# Patient Record
Sex: Female | Born: 1935 | Hispanic: No
Health system: Southern US, Community
[De-identification: ages and names within clinical notes are randomized; demographics above are authoritative.]

---

## 2004-01-08 ENCOUNTER — Other Ambulatory Visit: Payer: Self-pay

## 2004-07-01 ENCOUNTER — Other Ambulatory Visit: Payer: Self-pay

## 2004-11-04 ENCOUNTER — Ambulatory Visit: Payer: Self-pay | Admitting: Unknown Physician Specialty

## 2005-09-07 ENCOUNTER — Ambulatory Visit: Payer: Self-pay | Admitting: *Deleted

## 2005-09-19 ENCOUNTER — Ambulatory Visit: Payer: Self-pay | Admitting: *Deleted

## 2005-10-13 ENCOUNTER — Ambulatory Visit: Payer: Self-pay | Admitting: Unknown Physician Specialty

## 2005-11-29 ENCOUNTER — Ambulatory Visit: Payer: Self-pay | Admitting: Internal Medicine

## 2005-12-09 ENCOUNTER — Inpatient Hospital Stay: Payer: Self-pay | Admitting: Unknown Physician Specialty

## 2005-12-09 ENCOUNTER — Other Ambulatory Visit: Payer: Self-pay

## 2006-01-10 ENCOUNTER — Ambulatory Visit: Payer: Self-pay | Admitting: Unknown Physician Specialty

## 2006-01-22 ENCOUNTER — Ambulatory Visit: Payer: Self-pay | Admitting: Internal Medicine

## 2006-05-29 ENCOUNTER — Ambulatory Visit: Payer: Self-pay | Admitting: Internal Medicine

## 2006-10-26 ENCOUNTER — Ambulatory Visit: Payer: Self-pay | Admitting: Pain Medicine

## 2006-11-15 ENCOUNTER — Ambulatory Visit: Payer: Self-pay | Admitting: Pain Medicine

## 2006-12-19 ENCOUNTER — Ambulatory Visit: Payer: Self-pay | Admitting: Pain Medicine

## 2007-01-03 ENCOUNTER — Ambulatory Visit: Payer: Self-pay | Admitting: Pain Medicine

## 2007-02-01 ENCOUNTER — Ambulatory Visit: Payer: Self-pay | Admitting: Pain Medicine

## 2007-02-12 ENCOUNTER — Ambulatory Visit: Payer: Self-pay | Admitting: Pain Medicine

## 2007-03-29 ENCOUNTER — Ambulatory Visit: Payer: Self-pay | Admitting: Pain Medicine

## 2007-04-09 ENCOUNTER — Ambulatory Visit: Payer: Self-pay | Admitting: Pain Medicine

## 2007-05-08 ENCOUNTER — Ambulatory Visit: Payer: Self-pay | Admitting: Pain Medicine

## 2007-05-14 ENCOUNTER — Ambulatory Visit: Payer: Self-pay | Admitting: Pain Medicine

## 2007-05-28 ENCOUNTER — Ambulatory Visit: Payer: Self-pay | Admitting: Internal Medicine

## 2007-06-21 ENCOUNTER — Ambulatory Visit: Payer: Self-pay | Admitting: Pain Medicine

## 2007-06-27 ENCOUNTER — Ambulatory Visit: Payer: Self-pay | Admitting: Pain Medicine

## 2007-08-09 ENCOUNTER — Ambulatory Visit: Payer: Self-pay | Admitting: Pain Medicine

## 2007-10-22 ENCOUNTER — Ambulatory Visit: Payer: Self-pay | Admitting: Pain Medicine

## 2007-10-29 ENCOUNTER — Ambulatory Visit: Payer: Self-pay | Admitting: Pain Medicine

## 2007-11-20 ENCOUNTER — Ambulatory Visit: Payer: Self-pay | Admitting: Pain Medicine

## 2007-12-03 ENCOUNTER — Ambulatory Visit: Payer: Self-pay | Admitting: Pain Medicine

## 2008-01-29 ENCOUNTER — Ambulatory Visit: Payer: Self-pay | Admitting: Pain Medicine

## 2008-03-25 ENCOUNTER — Ambulatory Visit: Payer: Self-pay | Admitting: Pain Medicine

## 2010-02-08 ENCOUNTER — Ambulatory Visit: Payer: Self-pay | Admitting: Internal Medicine

## 2011-12-12 ENCOUNTER — Emergency Department: Payer: Self-pay | Admitting: Emergency Medicine

## 2012-05-21 ENCOUNTER — Inpatient Hospital Stay: Payer: Self-pay | Admitting: Specialist

## 2012-05-21 LAB — COMPREHENSIVE METABOLIC PANEL
Albumin: 3.8 g/dL (ref 3.4–5.0)
Alkaline Phosphatase: 96 U/L (ref 50–136)
Anion Gap: 11 (ref 7–16)
BUN: 25 mg/dL — ABNORMAL HIGH (ref 7–18)
Bilirubin,Total: 0.6 mg/dL (ref 0.2–1.0)
Calcium, Total: 8.8 mg/dL (ref 8.5–10.1)
Creatinine: 1.41 mg/dL — ABNORMAL HIGH (ref 0.60–1.30)
EGFR (Non-African Amer.): 32 — ABNORMAL LOW
Glucose: 110 mg/dL — ABNORMAL HIGH (ref 65–99)
Osmolality: 281 (ref 275–301)
Potassium: 3.5 mmol/L (ref 3.5–5.1)
SGPT (ALT): 16 U/L
Sodium: 138 mmol/L (ref 136–145)

## 2012-05-21 LAB — URINALYSIS, COMPLETE
Blood: NEGATIVE
Ketone: NEGATIVE
Nitrite: NEGATIVE
Ph: 6 (ref 4.5–8.0)
Protein: 100
Specific Gravity: 1.01 (ref 1.003–1.030)
WBC UR: <1 /HPF (ref 0–5)

## 2012-05-21 LAB — CBC
HGB: 12.7 g/dL (ref 12.0–16.0)
MCH: 28 pg (ref 26.0–34.0)
Platelet: 196 10*3/uL (ref 150–440)
RBC: 4.54 10*6/uL (ref 3.80–5.20)
RDW: 15.4 % — ABNORMAL HIGH (ref 11.5–14.5)
WBC: 9.7 10*3/uL (ref 3.6–11.0)

## 2012-05-21 LAB — PROTIME-INR: Prothrombin Time: 13.2 secs (ref 11.5–14.7)

## 2012-05-21 LAB — CK TOTAL AND CKMB (NOT AT ARMC): CK-MB: 1.2 ng/mL (ref 0.5–3.6)

## 2012-05-21 LAB — TROPONIN I: Troponin-I: <0.02 ng/mL

## 2012-05-21 LAB — APTT: Activated PTT: 31.2 secs (ref 23.6–35.9)

## 2012-05-23 LAB — CBC WITH DIFFERENTIAL/PLATELET
Basophil #: 0.1 10*3/uL (ref 0.0–0.1)
Basophil %: 0.6 %
Eosinophil #: 0.1 10*3/uL (ref 0.0–0.7)
HCT: 23.6 % — ABNORMAL LOW (ref 35.0–47.0)
Lymphocyte #: 1 10*3/uL (ref 1.0–3.6)
MCH: 27.9 pg (ref 26.0–34.0)
MCHC: 32.2 g/dL (ref 32.0–36.0)
MCV: 87 fL (ref 80–100)
RBC: 2.73 10*6/uL — ABNORMAL LOW (ref 3.80–5.20)
RDW: 15.3 % — ABNORMAL HIGH (ref 11.5–14.5)
WBC: 11.3 10*3/uL — ABNORMAL HIGH (ref 3.6–11.0)

## 2012-05-23 LAB — BASIC METABOLIC PANEL
Anion Gap: 11 (ref 7–16)
BUN: 32 mg/dL — ABNORMAL HIGH (ref 7–18)
Co2: 26 mmol/L (ref 21–32)
Creatinine: 2.06 mg/dL — ABNORMAL HIGH (ref 0.60–1.30)
EGFR (Non-African Amer.): 20 — ABNORMAL LOW
Glucose: 128 mg/dL — ABNORMAL HIGH (ref 65–99)
Osmolality: 280 (ref 275–301)
Potassium: 4.5 mmol/L (ref 3.5–5.1)

## 2012-05-24 LAB — BASIC METABOLIC PANEL
Anion Gap: 8 (ref 7–16)
BUN: 37 mg/dL — ABNORMAL HIGH (ref 7–18)
EGFR (African American): 24 — ABNORMAL LOW
EGFR (Non-African Amer.): 21 — ABNORMAL LOW
Glucose: 105 mg/dL — ABNORMAL HIGH (ref 65–99)
Osmolality: 277 (ref 275–301)
Potassium: 4.2 mmol/L (ref 3.5–5.1)
Sodium: 134 mmol/L — ABNORMAL LOW (ref 136–145)

## 2012-05-24 LAB — CBC WITH DIFFERENTIAL/PLATELET
Basophil #: 0 10*3/uL (ref 0.0–0.1)
Basophil %: 0.3 %
Eosinophil %: 1.4 %
HGB: 7.8 g/dL — ABNORMAL LOW (ref 12.0–16.0)
Lymphocyte %: 8.9 %
MCH: 28.7 pg (ref 26.0–34.0)
MCHC: 33.3 g/dL (ref 32.0–36.0)
MCV: 86 fL (ref 80–100)
Monocyte #: 0.9 x10 3/mm (ref 0.2–0.9)
Neutrophil #: 8 10*3/uL — ABNORMAL HIGH (ref 1.4–6.5)
Neutrophil %: 80.5 %
RBC: 2.72 10*6/uL — ABNORMAL LOW (ref 3.80–5.20)

## 2012-05-25 ENCOUNTER — Encounter: Payer: Self-pay | Admitting: Internal Medicine

## 2012-05-25 LAB — CBC WITH DIFFERENTIAL/PLATELET
Basophil %: 0.3 %
Eosinophil #: 0.2 10*3/uL (ref 0.0–0.7)
Eosinophil %: 2.1 %
HCT: 26 % — ABNORMAL LOW (ref 35.0–47.0)
Lymphocyte %: 9.5 %
MCH: 30.5 pg (ref 26.0–34.0)
MCV: 88 fL (ref 80–100)
Monocyte #: 0.8 x10 3/mm (ref 0.2–0.9)
Monocyte %: 9 %
Neutrophil #: 6.8 10*3/uL — ABNORMAL HIGH (ref 1.4–6.5)
RDW: 15.1 % — ABNORMAL HIGH (ref 11.5–14.5)
WBC: 8.5 10*3/uL (ref 3.6–11.0)

## 2012-05-25 LAB — BASIC METABOLIC PANEL
Anion Gap: 9 (ref 7–16)
BUN: 33 mg/dL — ABNORMAL HIGH (ref 7–18)
Calcium, Total: 7.5 mg/dL — ABNORMAL LOW (ref 8.5–10.1)
Chloride: 102 mmol/L (ref 98–107)
EGFR (Non-African Amer.): 27 — ABNORMAL LOW
Glucose: 102 mg/dL — ABNORMAL HIGH (ref 65–99)
Osmolality: 281 (ref 275–301)
Potassium: 4.3 mmol/L (ref 3.5–5.1)

## 2012-06-07 ENCOUNTER — Encounter: Payer: Self-pay | Admitting: Internal Medicine

## 2012-07-08 ENCOUNTER — Encounter: Payer: Self-pay | Admitting: Internal Medicine

## 2013-05-07 ENCOUNTER — Ambulatory Visit: Payer: Self-pay | Admitting: Internal Medicine

## 2013-05-19 ENCOUNTER — Inpatient Hospital Stay: Payer: Self-pay | Admitting: Internal Medicine

## 2013-05-19 LAB — CBC
HCT: 25.4 % — ABNORMAL LOW (ref 35.0–47.0)
HGB: 8.3 g/dL — ABNORMAL LOW (ref 12.0–16.0)
MCH: 28.6 pg (ref 26.0–34.0)
MCV: 87 fL (ref 80–100)
Platelet: 129 10*3/uL — ABNORMAL LOW (ref 150–440)
RDW: 15.3 % — ABNORMAL HIGH (ref 11.5–14.5)
WBC: 7.1 10*3/uL (ref 3.6–11.0)

## 2013-05-19 LAB — URINALYSIS, COMPLETE
Bilirubin,UR: NEGATIVE
Blood: NEGATIVE
Nitrite: NEGATIVE
Ph: 6 (ref 4.5–8.0)
Protein: 100
RBC,UR: 2 /HPF (ref 0–5)
Specific Gravity: 1.016 (ref 1.003–1.030)
Squamous Epithelial: 6
WBC UR: 2 /HPF (ref 0–5)

## 2013-05-19 LAB — BASIC METABOLIC PANEL
BUN: 39 mg/dL — ABNORMAL HIGH (ref 7–18)
Calcium, Total: 8.3 mg/dL — ABNORMAL LOW (ref 8.5–10.1)
Co2: 39 mmol/L — ABNORMAL HIGH (ref 21–32)
Creatinine: 1.63 mg/dL — ABNORMAL HIGH (ref 0.60–1.30)
EGFR (African American): 31 — ABNORMAL LOW
Potassium: 4 mmol/L (ref 3.5–5.1)
Sodium: 141 mmol/L (ref 136–145)

## 2013-05-19 LAB — TROPONIN I: Troponin-I: 0.03 ng/mL

## 2013-05-20 LAB — CBC WITH DIFFERENTIAL/PLATELET
Basophil #: 0 10*3/uL (ref 0.0–0.1)
Eosinophil #: 0 10*3/uL (ref 0.0–0.7)
Eosinophil %: 0 %
Lymphocyte %: 10.6 %
MCHC: 32.5 g/dL (ref 32.0–36.0)
MCV: 87 fL (ref 80–100)
Monocyte #: 0.1 x10 3/mm — ABNORMAL LOW (ref 0.2–0.9)
Monocyte %: 2.2 %
RBC: 3.13 10*6/uL — ABNORMAL LOW (ref 3.80–5.20)
RDW: 15 % — ABNORMAL HIGH (ref 11.5–14.5)
WBC: 6 10*3/uL (ref 3.6–11.0)

## 2013-05-20 LAB — IRON AND TIBC
Iron Bind.Cap.(Total): 249 ug/dL — ABNORMAL LOW (ref 250–450)
Iron Saturation: 16 %
Iron: 41 ug/dL — ABNORMAL LOW (ref 50–170)
Unbound Iron-Bind.Cap.: 208 ug/dL

## 2013-05-20 LAB — BASIC METABOLIC PANEL
Anion Gap: 3 — ABNORMAL LOW (ref 7–16)
BUN: 37 mg/dL — ABNORMAL HIGH (ref 7–18)
Calcium, Total: 8.6 mg/dL (ref 8.5–10.1)
Chloride: 100 mmol/L (ref 98–107)
Co2: 38 mmol/L — ABNORMAL HIGH (ref 21–32)
Creatinine: 1.47 mg/dL — ABNORMAL HIGH (ref 0.60–1.30)
EGFR (Non-African Amer.): 30 — ABNORMAL LOW
Glucose: 121 mg/dL — ABNORMAL HIGH (ref 65–99)

## 2013-05-20 LAB — HEMOGLOBIN A1C: Hemoglobin A1C: 5.2 % (ref 4.2–6.3)

## 2013-05-20 LAB — PRO B NATRIURETIC PEPTIDE: B-Type Natriuretic Peptide: 18431 pg/mL — ABNORMAL HIGH (ref 0–450)

## 2013-05-20 LAB — TROPONIN I: Troponin-I: <0.02 ng/mL

## 2013-05-21 LAB — CBC WITH DIFFERENTIAL/PLATELET
Basophil #: 0 10*3/uL (ref 0.0–0.1)
Eosinophil %: 0.6 %
HCT: 25.2 % — ABNORMAL LOW (ref 35.0–47.0)
HGB: 8.4 g/dL — ABNORMAL LOW (ref 12.0–16.0)
Lymphocyte %: 11.1 %
MCHC: 33.5 g/dL (ref 32.0–36.0)
Monocyte #: 0.7 x10 3/mm (ref 0.2–0.9)
Neutrophil #: 7.8 10*3/uL — ABNORMAL HIGH (ref 1.4–6.5)
RBC: 2.95 10*6/uL — ABNORMAL LOW (ref 3.80–5.20)
RDW: 15.3 % — ABNORMAL HIGH (ref 11.5–14.5)
WBC: 9.7 10*3/uL (ref 3.6–11.0)

## 2013-05-21 LAB — BASIC METABOLIC PANEL
BUN: 48 mg/dL — ABNORMAL HIGH (ref 7–18)
Calcium, Total: 8.3 mg/dL — ABNORMAL LOW (ref 8.5–10.1)
Chloride: 98 mmol/L (ref 98–107)
Creatinine: 1.8 mg/dL — ABNORMAL HIGH (ref 0.60–1.30)
Glucose: 107 mg/dL — ABNORMAL HIGH (ref 65–99)
Potassium: 3.8 mmol/L (ref 3.5–5.1)

## 2013-05-22 LAB — BASIC METABOLIC PANEL
Anion Gap: 3 — ABNORMAL LOW (ref 7–16)
BUN: 61 mg/dL — ABNORMAL HIGH (ref 7–18)
Calcium, Total: 8.4 mg/dL — ABNORMAL LOW (ref 8.5–10.1)
Chloride: 96 mmol/L — ABNORMAL LOW (ref 98–107)
Creatinine: 2.14 mg/dL — ABNORMAL HIGH (ref 0.60–1.30)
EGFR (Non-African Amer.): 19 — ABNORMAL LOW
Glucose: 141 mg/dL — ABNORMAL HIGH (ref 65–99)
Osmolality: 295 (ref 275–301)
Sodium: 138 mmol/L (ref 136–145)

## 2013-05-23 LAB — CBC WITH DIFFERENTIAL/PLATELET
Basophil #: 0 10*3/uL (ref 0.0–0.1)
HCT: 24.8 % — ABNORMAL LOW (ref 35.0–47.0)
HGB: 8.4 g/dL — ABNORMAL LOW (ref 12.0–16.0)
Lymphocyte %: 7.5 %
MCH: 29.1 pg (ref 26.0–34.0)
MCHC: 33.9 g/dL (ref 32.0–36.0)
MCV: 86 fL (ref 80–100)
Monocyte #: 0.3 x10 3/mm (ref 0.2–0.9)
Monocyte %: 3.8 %
Neutrophil %: 88.6 %
Platelet: 127 10*3/uL — ABNORMAL LOW (ref 150–440)
RDW: 15.3 % — ABNORMAL HIGH (ref 11.5–14.5)
WBC: 8.3 10*3/uL (ref 3.6–11.0)

## 2013-05-23 LAB — BASIC METABOLIC PANEL
Anion Gap: 4 — ABNORMAL LOW (ref 7–16)
BUN: 76 mg/dL — ABNORMAL HIGH (ref 7–18)
Calcium, Total: 8.2 mg/dL — ABNORMAL LOW (ref 8.5–10.1)
Chloride: 97 mmol/L — ABNORMAL LOW (ref 98–107)
Creatinine: 2.36 mg/dL — ABNORMAL HIGH (ref 0.60–1.30)
Osmolality: 300 (ref 275–301)
Potassium: 4.2 mmol/L (ref 3.5–5.1)
Sodium: 137 mmol/L (ref 136–145)

## 2013-05-24 ENCOUNTER — Encounter: Payer: Self-pay | Admitting: Internal Medicine

## 2013-05-24 LAB — BASIC METABOLIC PANEL
Anion Gap: 2 — ABNORMAL LOW (ref 7–16)
BUN: 83 mg/dL — ABNORMAL HIGH (ref 7–18)
Calcium, Total: 8.3 mg/dL — ABNORMAL LOW (ref 8.5–10.1)
Creatinine: 2.28 mg/dL — ABNORMAL HIGH (ref 0.60–1.30)
EGFR (African American): 21 — ABNORMAL LOW
EGFR (Non-African Amer.): 18 — ABNORMAL LOW
Glucose: 105 mg/dL — ABNORMAL HIGH (ref 65–99)
Osmolality: 299 (ref 275–301)
Potassium: 4 mmol/L (ref 3.5–5.1)
Sodium: 137 mmol/L (ref 136–145)

## 2013-05-24 LAB — CULTURE, BLOOD (SINGLE)

## 2013-05-28 LAB — CBC WITH DIFFERENTIAL/PLATELET
Basophil #: 0 10*3/uL (ref 0.0–0.1)
Basophil %: 0.1 %
Eosinophil #: 0.2 10*3/uL (ref 0.0–0.7)
Eosinophil %: 1.8 %
HCT: 25.2 % — ABNORMAL LOW (ref 35.0–47.0)
HGB: 8.5 g/dL — ABNORMAL LOW (ref 12.0–16.0)
Lymphocyte #: 1 10*3/uL (ref 1.0–3.6)
MCH: 29.1 pg (ref 26.0–34.0)
MCV: 87 fL (ref 80–100)
Monocyte #: 0.9 x10 3/mm (ref 0.2–0.9)
Monocyte %: 9.8 %
Neutrophil #: 6.9 10*3/uL — ABNORMAL HIGH (ref 1.4–6.5)
RBC: 2.91 10*6/uL — ABNORMAL LOW (ref 3.80–5.20)

## 2013-05-28 LAB — BASIC METABOLIC PANEL
Chloride: 99 mmol/L (ref 98–107)
Co2: 36 mmol/L — ABNORMAL HIGH (ref 21–32)
Creatinine: 2.35 mg/dL — ABNORMAL HIGH (ref 0.60–1.30)
EGFR (African American): 20 — ABNORMAL LOW
EGFR (Non-African Amer.): 17 — ABNORMAL LOW
Glucose: 105 mg/dL — ABNORMAL HIGH (ref 65–99)

## 2013-05-31 LAB — CBC WITH DIFFERENTIAL/PLATELET
Basophil #: 0 10*3/uL (ref 0.0–0.1)
Eosinophil #: 0.2 10*3/uL (ref 0.0–0.7)
Lymphocyte #: 1.1 10*3/uL (ref 1.0–3.6)
Lymphocyte %: 14.7 %
MCH: 28.4 pg (ref 26.0–34.0)
MCHC: 32.4 g/dL (ref 32.0–36.0)
MCV: 88 fL (ref 80–100)
Monocyte #: 0.5 x10 3/mm (ref 0.2–0.9)
Monocyte %: 7.3 %
Neutrophil #: 5.6 10*3/uL (ref 1.4–6.5)
Neutrophil %: 74.6 %
WBC: 7.5 10*3/uL (ref 3.6–11.0)

## 2013-05-31 LAB — BASIC METABOLIC PANEL
Anion Gap: 4 — ABNORMAL LOW (ref 7–16)
Calcium, Total: 8.4 mg/dL — ABNORMAL LOW (ref 8.5–10.1)
Co2: 34 mmol/L — ABNORMAL HIGH (ref 21–32)
Glucose: 106 mg/dL — ABNORMAL HIGH (ref 65–99)
Osmolality: 296 (ref 275–301)
Potassium: 4.8 mmol/L (ref 3.5–5.1)
Sodium: 139 mmol/L (ref 136–145)

## 2013-06-07 ENCOUNTER — Ambulatory Visit: Payer: Self-pay | Admitting: Nurse Practitioner

## 2013-06-07 ENCOUNTER — Encounter: Payer: Self-pay | Admitting: Internal Medicine

## 2013-08-07 DEATH — deceased

## 2013-09-08 IMAGING — CT CT CHEST W/O CM
2 series · 16 of 30 positions shown, 19 images · non-contrast
Comparison: none

REASON FOR EXAM: progressive dyspnea/abnormal CXR in pt with pulmonary
fibrosis/chronic resp fail
COMMENTS:

[Series 2: soft tissue · axial · 0.59mm/px · z∈[-74,+40]mm · 4 of 94 slices shown]
[im 7/94  mediastinal]
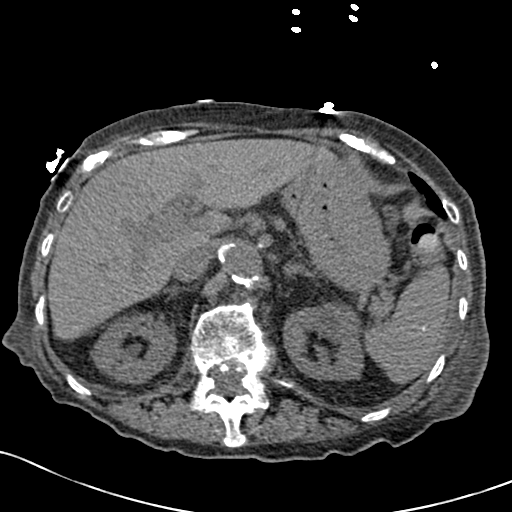
[im 20/94  mediastinal]
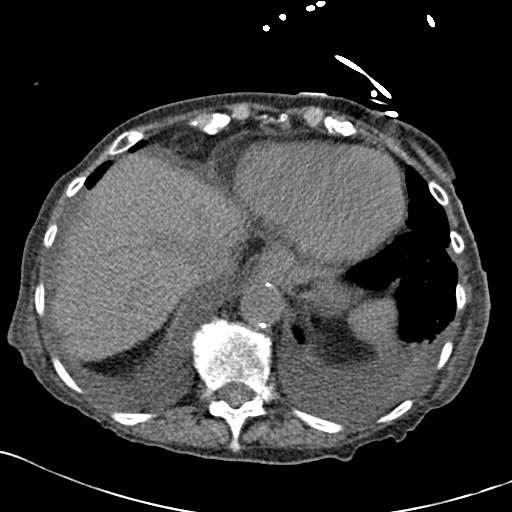
[im 34/94  mediastinal]
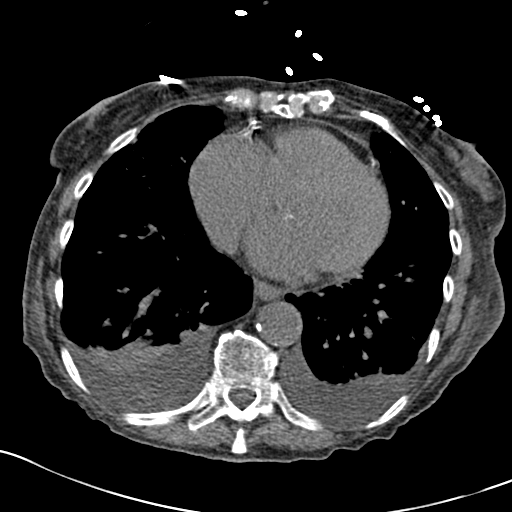
[im 45/94  mediastinal]
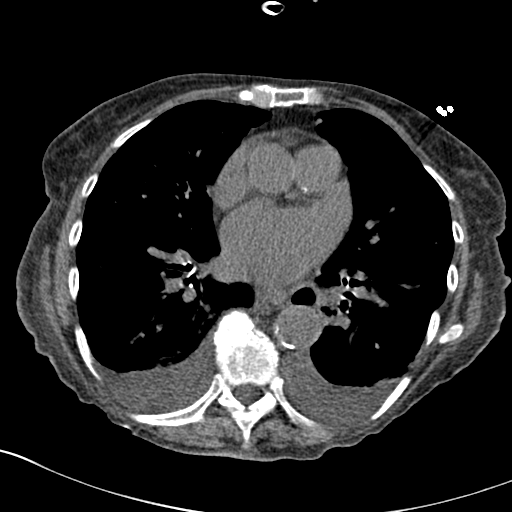

[Series 4: lung windows · axial · 0.59mm/px · z∈[-41,+166]mm · 12 of 83 slices shown, 15 images]
[im 7/83  mediastinal]
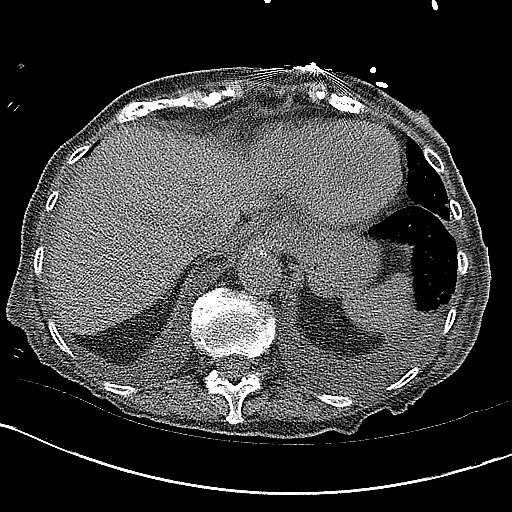
[im 7/83  lung]
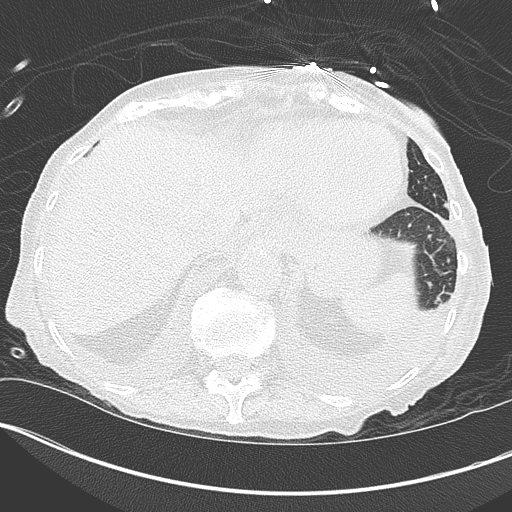
[im 14/83  lung]
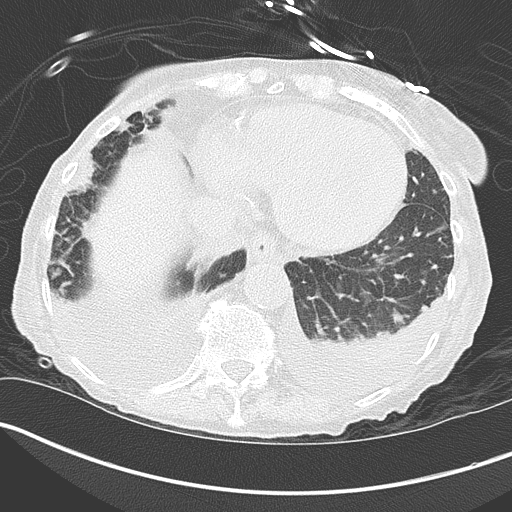
[im 21/83  lung]
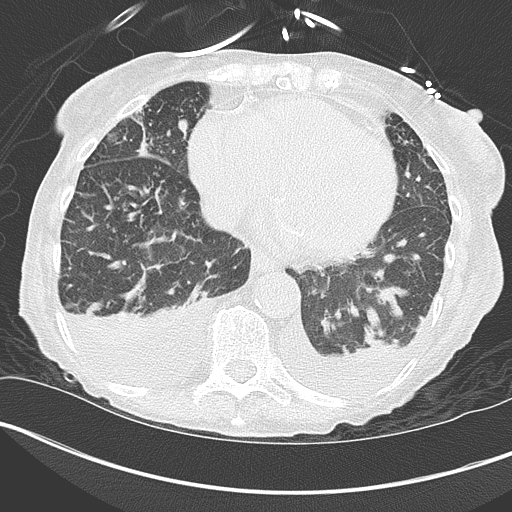
[im 28/83  lung]
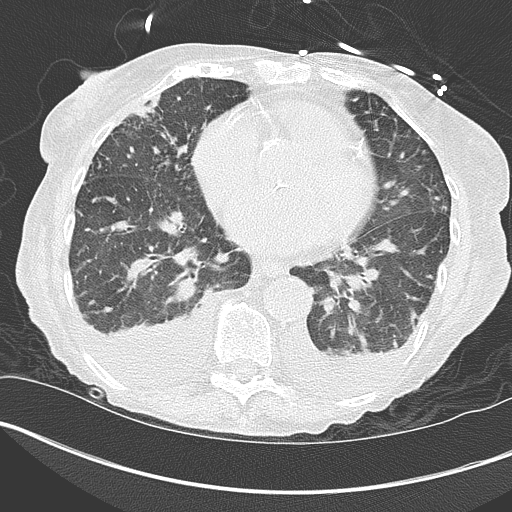
[im 34/83  mediastinal]
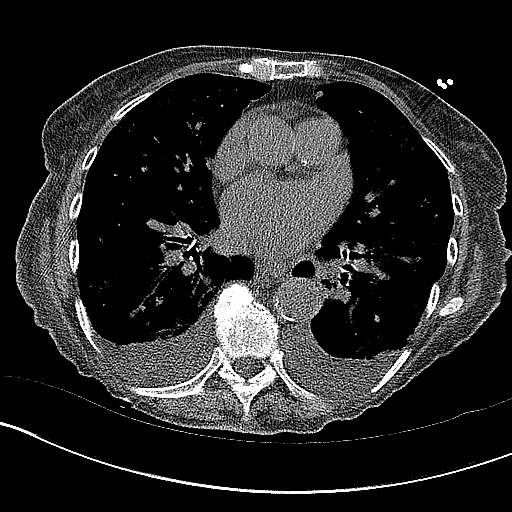
[im 34/83  lung]
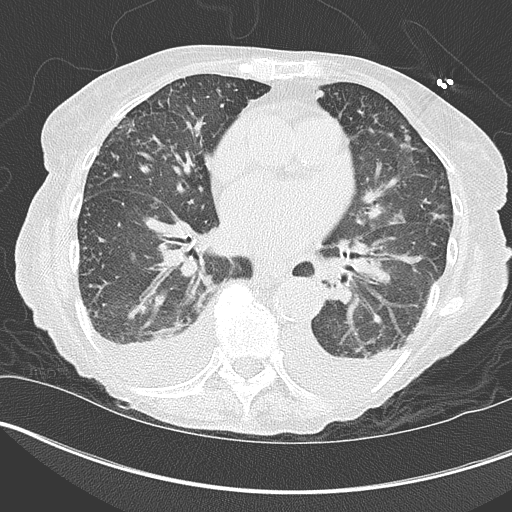
[im 35/83  lung]
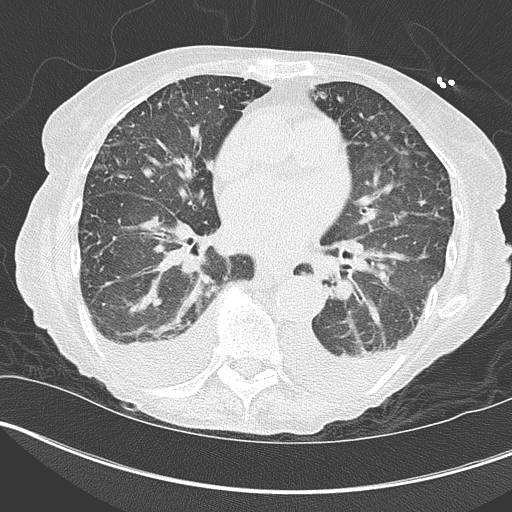
[im 42/83  lung]
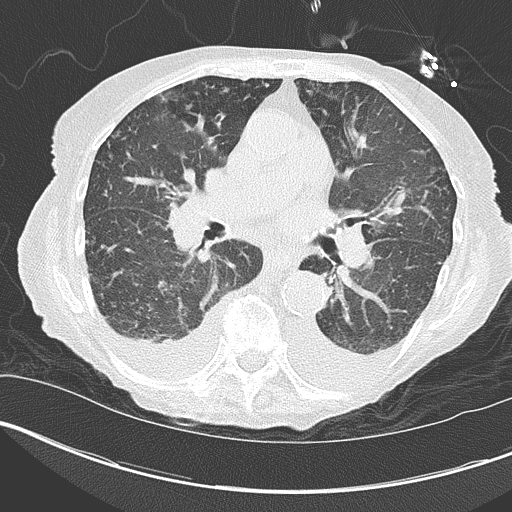
[im 48/83  lung]
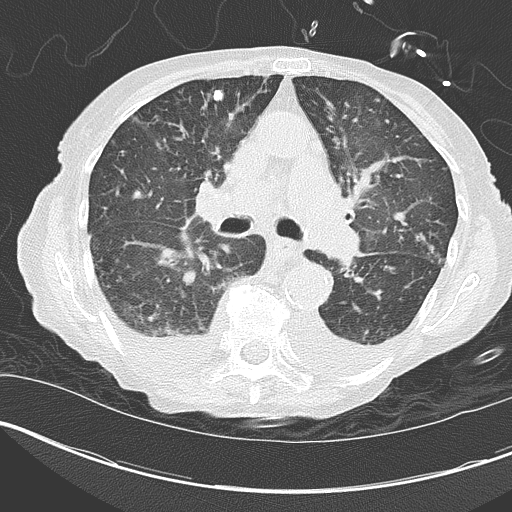
[im 55/83  mediastinal]
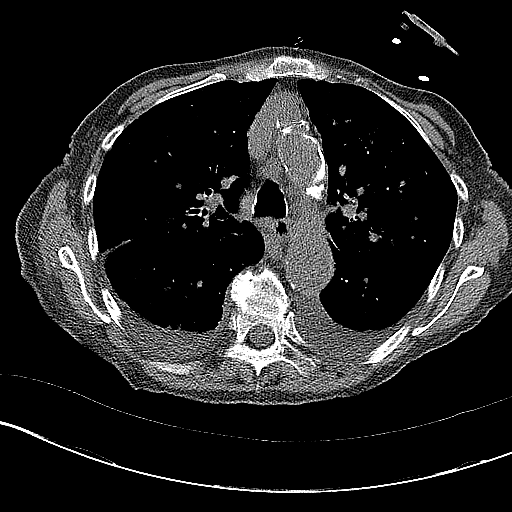
[im 55/83  lung]
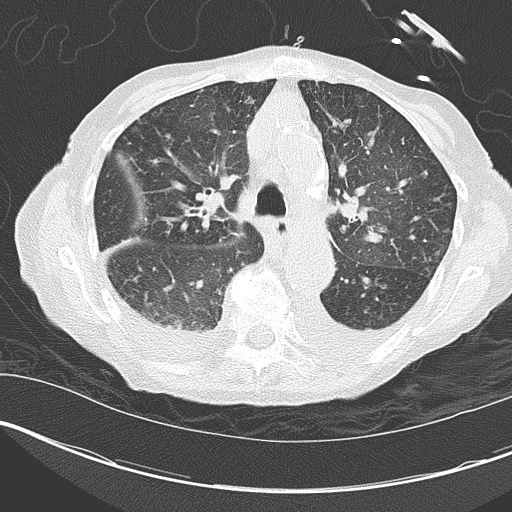
[im 62/83  lung]
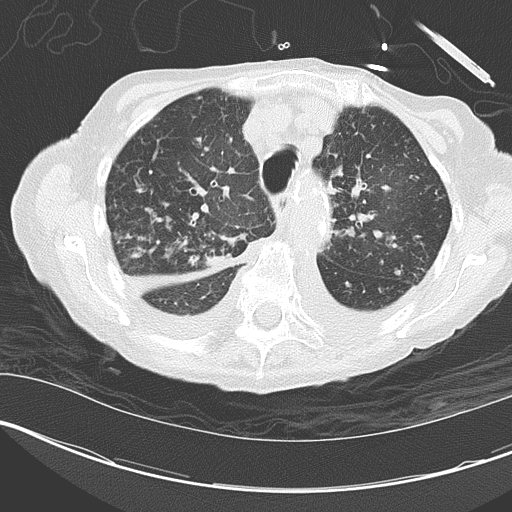
[im 69/83  lung]
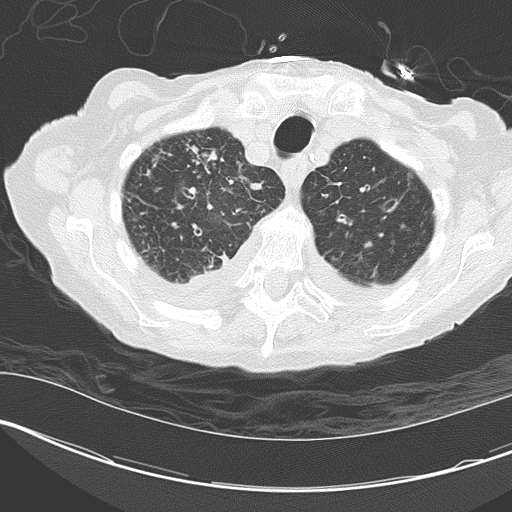
[im 76/83  lung]
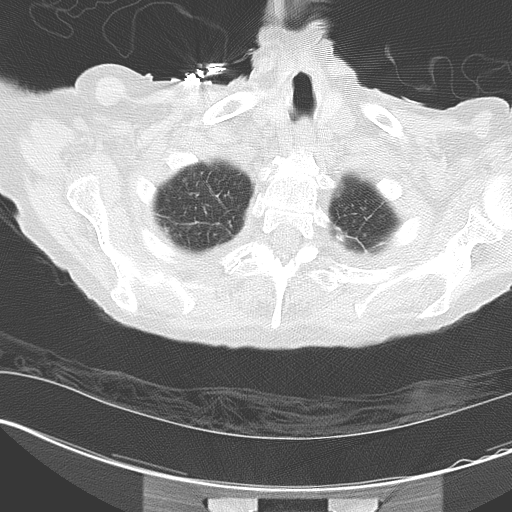

[16 of 30 positions shown; findings below may reference images not displayed]

PROCEDURE:     CT  - CT CHEST WITHOUT CONTRAST  - May 20, 2013  [DATE]

RESULT:     Axial CT scanning was performed through the chest with
reconstructions at 3 mm intervals and slice thicknesses. Images were
repeated due to motion artifact.

At mediastinal window settings there are small to moderate sized bilateral
pleural effusions layering posteriorly. There are mildly enlarged
pretracheal and precarinal and subcarinal lymph nodes measuring up to 2 cm
in long axis. There are smaller enlarged hilar lymph nodes which are poorly
defined given the lack of IV contrast. The cardiac chambers are top normal
in size. There is no significant pericardial effusion. The caliber of the
thoracic aorta is normal.

At lung window settings there is evidence of previous granulomatous
infection manifested by a calcified nodule in the anterior-inferior aspect
of the right lung. There are fibrotic changes in both lungs. There is mild
bronchiectatic change bilaterally. There is no classic alveolar pneumonia.

Within the upper abdomen the observed portions of the liver and spleen
appear normal. There are no adrenal masses. There is likely a cyst in the
upper pole of the right kidney which measures 2 cm in greatest dimension.
IMPRESSION: 1. There are emphysematous changes and bronchiectatic changes in both lungs
with superimposed pulmonary fibrosis. There is peribronchial cuffing
bilaterally and evidence of previous granulomatous infection.
2. There are moderate sized bilateral pleural effusions layering
posteriorly.
3. There are enlarged mediastinal and likely enlarged hilar lymph nodes. The
study is limited in this regard due to the lack of IV contrast.
4. There is no overt evidence of CHF though there is mild enlargement of the
cardiac chambers.

[REDACTED]

## 2015-02-24 NOTE — Consult Note (Signed)
PATIENT NAME:  Jasmine NicelySTEELE, Aramis L MR#:  [id number] DATE OF BIRTH:  04-Jun-1918  DATE OF CONSULTATION:  05/21/2012  REFERRING PHYSICIAN:  Myra Rudehristopher Smith, MD CONSULTING PHYSICIAN:  Darrick MeigsSangeeta Lashaun Krapf, MD  PRIMARY CARE PHYSICIAN: Daniel NonesBert Klein, MD  REASON FOR CONSULTATION: Preop medical evaluation and management of hypertension.   HISTORY OF PRESENT ILLNESS: The patient is a 81 year old female with past medical history of hypertension, hyperlipidemia, gastroesophageal reflux disease, and anxiety who had a mechanical fall today where she fell backwards while stocking her groceries. She denies any loss of consciousness. Subsequently she had pain in her left hip and came to the ER where evaluation revealed that she has a left hip fracture. She has been admitted to the orthopedic service for surgical management. When the patient presented, her blood pressure was very high. Medicine has been consulted for hypertension, management, and preoperative evaluation. The patient denies any history of CAD, CVA, or CHF. She now ambulates with a walker, but is reasonably active. She denies any chest pain, shortness of breath, cough, or fever.   ALLERGIES: Sudafed, penicillin, amoxicillin, and tetanus toxoid.  CURRENT MEDICATIONS:  1. Buspirone 7.5 mg twice a day. 2. Clonidine 0.2 mg twice a day. 3. Imdur 60 mg daily.  4. Lopressor 100 mg twice a day. 5. Omeprazole 20 mg daily.  6. Pravachol 40 mg daily.   PAST MEDICAL HISTORY:  1. Hypertension. 2. Hyperlipidemia. 3. Anxiety. 4. Gastroesophageal reflux disease.   FAMILY HISTORY: Father had an abdominal aortic aneurysm and cancer. Mother had renal failure.   PAST SURGICAL HISTORY: Hysterectomy.   SOCIAL HISTORY: The patient quit smoking in 1992. Denies any history of alcohol or drug abuse. She is a widow and lives with her sister.   REVIEW OF SYSTEMS: CONSTITUTIONAL: Denies any fever, fatigue, or weakness. EYES: Denies any blurred or double vision. ENT:  Denies any tinnitus or ear pain. RESPIRATORY: Denies any cough or wheezing. CARDIOVASCULAR: Denies any chest pain or palpitations. GASTROINTESTINAL: Denies any nausea, vomiting, diarrhea, or abdominal pain. GU: Denies any dysuria or hematuria. ENDOCRINE: Denies any polyuria or nocturia. HEME/LYMPH: Denies any anemia or easy bruisability. INTEGUMENT: Denies any acne or rash. MUSCULOSKELETAL: Reports pain and soreness in her left hip. Denies any swelling or gout. NEUROLOGICAL: Denies any numbness or weakness. PSYCH: Has history of anxiety.   PHYSICAL EXAMINATION:   VITAL SIGNS: Temperature afebrile, heart rate 79, respiratory rate 18, blood pressure initially was 234/106, and pulse oximetry 95%.   GENERAL: The patient is an elderly, thin built Caucasian female lying comfortably in bed, not in acute distress.   HEAD: Atraumatic, normocephalic.   EYES: No pallor, icterus, or cyanosis. Pupils are equally round and reactive to light and accommodation. Extraocular movements are intact.   ENT: Dry mucous membranes. No oropharyngeal erythema or thrush.   NECK: Supple. No masses. No JVD. No thyromegaly or lymphadenopathy.   CHEST WALL: No tenderness to palpation. Not using accessory muscles of respiration. No intercostal muscle retraction.   LUNGS: Bilaterally clear to auscultation. No wheezing, rales, or rhonchi.   HEART: S1 and S2 regular. There is a systolic murmur. No rubs or gallops.   ABDOMEN: Soft, nontender, and nondistended. No guarding or rigidity. No organomegaly.   SKIN: No rashes or lesions.   PERIPHERIES: Trace pedal edema. 2+ pedal pulses.   MUSCULOSKELETAL: No cyanosis or clubbing.   NEUROLOGIC: Awake, alert, and oriented x3. Nonfocal neurological exam. Cranial nerves grossly intact.   PSYCH: Normal mood and affect.   LABORATORY, DIAGNOSTIC AND RADIOLOGIC  DATA: EKG shows normal sinus rhythm with PVCs. No evidence of any acute ischemic changes.  Urinalysis: No evidence of  infection.   Pelvic x-ray: Left intertrochanteric hip fracture.   CT of the head: Right frontal soft tissue swelling. Exam is otherwise normal.  CBC normal. Complete metabolic panel normal other than glucose 110, BUN 25, and creatinine 1.41. PT- INR and PTT normal. Cardiac enzymes normal.   ASSESSMENT AND PLAN: A 81 year old female with past medical history of hypertension, anxiety, and gastroesophageal reflux disease being admitted to the orthopedic service for left hip fracture. Medicine is consulted for preop medical evaluation.  1. Preop medical evaluation. The patient normally ambulates with a walker, but is overall active. She denies any history of CAD, CVA, or CHF. She is moderate to high risk for surgery given her age and hypertension. I discussed with the patient in detail, however, she wishes to proceed with surgery. There is no contraindication to proceeding with surgery at this time.  2. Accelerated hypertension, possibly due to acute pain and distress. We will restart all of the patient's current medications and also place on p.r.n. hydralazine and labetalol. We will adjust medications as needed to achieve good hypertensive control. 3. Hyperlipidemia. We will continue Pravachol. 4. History of anxiety. We will continue buspirone.  5. History of gastroesophageal reflux disease. We will continue PPI therapy.  6. Preoperatively we will continue the patient's beta blocker, place on p.r.n. nebulizer treatments and incentive spirometry.  7. Left hip fracture. Further management as per orthopedic surgeon.  I reviewed all medical records and I discussed the plan of care with the patient.   TIME SPENT: 75 minutes.  ____________________________ Darrick Meigs, MD sp:slb D: 05/21/2012 18:33:54 ET T: 05/22/2012 10:24:31 ET JOB#: [id number]  cc: Darrick Meigs, MD, <Dictator> Lynnea Ferrier, MD Darrick Meigs MD ELECTRONICALLY SIGNED 05/22/2012 13:08

## 2015-02-24 NOTE — Op Note (Signed)
PATIENT NAME:  [name], [name] MR#:  [id number] DATE OF BIRTH:  04/16/1918  DATE OF PROCEDURE:  05/22/2012  PREOPERATIVE DIAGNOSIS: Displaced left base of femoral neck fracture.   POSTOPERATIVE DIAGNOSIS: Displaced left base of femoral neck fracture.   PROCEDURE: Open reduction and internal fixation left hip fracture with compression screw.   SURGEON: Clare Gandyhristopher E. Jermanie Minshall, MD  ANESTHESIA: Spinal.   COMPLICATIONS: None.   ESTIMATED BLOOD LOSS: 200 mL.   DESCRIPTION OF PROCEDURE: 300 mg of clindamycin was given intravenously prior to the procedure. Spinal anesthesia is induced. The patient is placed on the fracture table and secured in the usual manner for left hip surgery. Reduction for the left hip was performed using mild abduction, longitudinal traction and internal rotation. The reduction is checked in the AP and lateral views and is seen to be almost anatomic. The lateral left hip is thoroughly prepped with alcohol and ChloraPrep and draped in standard sterile fashion. A standard lateral incision is made and the dissection carried down to the lateral femoral cortex. Guidepin is inserted into the femoral head and neck and is seen to be in satisfactory position in the AP and lateral views. The femoral head and neck is then reamed and the 80 mm in length compression screw is chosen. Prior to inserting this a guidepin for a 7.3 cannulated cancellus screw is inserted superiorly to this to prevent femoral head rotation. The compression screw was then inserted into the femoral head and neck and then a 70 mm 7.3 cannulated cancellus screw is inserted superior to this. The compression screw was then fixed to the femur using a 130 degrees three hole sideplate using standard cortical screws. Final position of the reduction and the internal fixation is checked in the AP and lateral views and is seen to be satisfactory. Wound is thoroughly irrigated multiple times with normal saline. Vastus lateralis is  closed with 2-0 Vicryl. The fascia lata is closed with #1 Ethibond. Subcutaneous tissue is closed with 3-0 Vicryl and the skin is closed with the skin stapler. Soft bulky dressing is applied.   Patient is returned to the hospital bed and to the recovery room in satisfactory condition having tolerated the procedure quite well.    ____________________________ Clare Gandyhristopher E. Devynn Scheff, MD ces:cms D: 05/22/2012 15:38:54 ET T: 05/22/2012 16:07:49 ET JOB#: [id number]  cc: Clare Gandyhristopher E. Amariyana Heacox, MD, <Dictator> Clare GandyHRISTOPHER E Glorimar Stroope MD ELECTRONICALLY SIGNED 05/25/2012 9:20

## 2015-02-24 NOTE — Discharge Summary (Signed)
PATIENT NAME:  [name], [name] MR#:  [id number] DATE OF BIRTH:  November 26, 1917  DATE OF ADMISSION:  05/21/2012 DATE OF DISCHARGE:  05/25/2012  FINAL DIAGNOSES:  1. Comminuted intertrochanteric fracture left hip. 2. Hypertension.  3. Hyperlipidemia.  4. Anxiety. 5. Gastroesophageal reflux.   PROCEDURE: 05/22/2012 open reduction and internal fixation left hip with compression hip nail.   COMPLICATIONS: None.   CONSULTATION: [facility].   HISTORY OF PRESENT ILLNESS: Patient is a 81 year old female who lived at home and fell backwards, was stocking her groceries. She had pain in her left hip was brought to the Emergency Room where exam and x-rays revealed an intertrochanteric fracture of left hip. She was admitted for medical evaluation and met clearance and surgery. Risks and benefits of surgery discussed with the patient and her sister who she lives with.   PAST MEDICAL HISTORY/ILLNESSES: As above.   HOME MEDICATIONS:  1. Buspirone 7.5 mg b.i.d.  2. Clonidine 0.2 mg b.i.d.  3. Imdur 60 mg daily.  4. Lopressor 100 mg b.i.d. 5. Omeprazole 20 mg daily.  6. Pravachol 40 mg daily.   ALLERGIES: Sudafed, penicillin, amoxicillin, tetanus toxoid.   FAMILY HISTORY: Positive for aortic aneurysm and cancer in her father. Renal failure in her mother.   PAST SURGICAL HISTORY: Hysterectomy.   SOCIAL HISTORY: Patient smoked for years but quit 20 years ago. She denies alcohol or drug abuse. She is a widow and lives with her sister.   REVIEW OF SYSTEMS: Otherwise unremarkable.  PHYSICAL EXAMINATION: On admission patient was alert and cooperative. Left leg was shortened, externally rotated. There was pain with range of motion. Neurovascular status was good distally. No other orthopedic injuries were noted.   LABORATORY, DIAGNOSTIC AND RADIOLOGICAL DATA: Laboratory data on admission was satisfactory.   HOSPITAL COURSE: On 05/22/2012 the patient underwent surgery by Dr. Christophe Louis with a  compression hip nailing of the left hip. Postoperatively she did well except for decrease in hemoglobin level. This was 7.6 on 05/23/2012 and she received 1 unit of packed cells. Hemoglobin was 7.8 the next day and she received another unit of packed cells. Hemoglobin was 9.1 on discharge date, 05/25/2012. Wound was benign. She was eating well but was slow with physical therapy. She is stable and deemed safe for skilled nursing transfer today. Rehabilitation potential is good. She will be seen in our office by Dr. Christophe Louis in two weeks.   ____________________________ Park Breed, MD hem:cms D: 05/25/2012 09:04:47 ET T: 05/25/2012 09:13:58 ET JOB#: [id number]  cc: Park Breed, MD, <Dictator> Tama High III, MD Park Breed MD ELECTRONICALLY SIGNED 05/26/2012 12:23

## 2015-02-27 NOTE — Discharge Summary (Signed)
PATIENT NAME:  Jasmine NicelySTEELE, [name] DATE OF BIRTH:  1918-07-16  DATE OF ADMISSION:  05/19/2013 DATE OF DISCHARGE:  05/24/2013   addendum  Addendum to the discharge summary dictated by Dr. Graciela HusbandsKlein on 05/23/2013.  The patient remained in the hospital overnight for further physical therapy evaluation. She was seen by the therapist last evening, who did recommend rehab placement because of her deconditioning. Overnight, the patient remained stable on oxygen. This morning, she is without complaints. Denies significant chest pain or shortness of breath. She will be transferred to the skilled nursing facility today for further care and rehabilitation. Her discharge medications and plans are unchanged and are according to the discharge summary dictated by Dr. Graciela HusbandsKlein yesterday.   ____________________________ Duane LopeJeffrey D. Judithann SheenSparks, MD jds:OSi D: 05/24/2013 07:47:00 ET T: 05/24/2013 07:55:38 ET JOB#: [id number]  cc: Duane LopeJeffrey D. Judithann SheenSparks, MD, <Dictator> Marcell Pfeifer Rodena Medin Deepa Barthel MD ELECTRONICALLY SIGNED 05/24/2013 9:38

## 2015-02-27 NOTE — Discharge Summary (Signed)
PATIENT NAME:  [name], OCONNOR MR#:  [id number] DATE OF BIRTH:  04/01/1918  DATE OF ADMISSION:  05/19/2013 DATE OF DISCHARGE:  05/23/2013  FINAL DIAGNOSES: 1. Acute on chronic respiratory failure.  2. Acute on chronic left and right-sided systolic congestive heart failure as source.  3. Evidence of pneumonia.  4. Chronic obstructive pulmonary disease with acute exacerbation.  5. Chronic kidney disease, stage III.  6. Hypertension, accelerated.  7. Hyperlipidemia.  8. Anxiety and depression.  9. Gastroesophageal reflux disease.  10. Osteoporosis.  11. Status post hysterectomy.  12. History of hip fracture.  13. History of cataract repair.  14. History of kyphoplasty.  15. Acute renal failure.   HISTORY AND PHYSICAL: Please see dictated admission history and physical.    SUMMARY COURSE: The patient was admitted with increasing shortness of breath beyond her baseline of chronic respiratory failure. She had chest x-ray performed, which revealed question of pneumonia. She has history of pulmonary fibrosis and chronic respiratory failure as noted above, so her chest x-rays were very difficult to read. Subsequently underwent CT scan, which showed that this area appeared to be more consistent with atelectasis or fluid retention. Her BNP was obtained and was markedly elevated at 18,431. Cardiac enzymes were followed, which were negative. She underwent echocardiogram which revealed LV EF reduced to 35 to 40% with a low LV systolic dysfunction, as well as severe MR, TR, severely elevated pulmonary pressures with estimated pulmonary pressure proximal at approximately 77. This was a worsening of her left-sided systolic dysfunction compared to prior, the right-sided systolic CHF had previously been documented.   She was initiated on diuretics and changed over to oral antibiotics to cover for any possible pneumonia in any of the above. She really had no fevers, chills, or other pneumonia symptoms,  however. As she diuresed, she improved significantly and oxygen level is able to be returned to her home rate; however, she was noted to have worsening renal insufficiency. Diuretics were held and electrolytes were followed. Hydration was encouraged. Her anemia stayed stable, she has history of chronic iron deficiency. She is on iron therapy for this. She is not a candidate for GI evaluation secondary to her poor respiratory status.   Palliative care did see the patient, and we discussed the possibility of hospice. The patient does not feel like she is ready for this, she would prefer to work with physical therapy and rehabilitation. She initially had wanted to go home, however, when she was up ambulating with physical therapy, the maximum distance was about 100 feet with significant shortness of breath. She is cared for at home by her elderly sister, who physically cannot take care of her at this point, so return to home did not appear to be safe and so nursing home/short-term rehabilitation was recommended. The patient will be discharged in stable condition with her physical activity up with a rolling walker as tolerated.   DIET: She should be weighed daily with call to physician for more than 2 pound gain in one day or 5 pounds in one week or increasing signs or symptoms of heart failure. She should follow a 2 gram sodium diet. MET-B, CBC, to be done in one week with results to the nursing home physician. Physical therapy and occupational therapy need to evaluate and treat the patient.  She will continue on 3 liters nasal cannula. Recommend that she drink Ensure 1 can a day to improve sudden severe protein calorie malnutrition.   DISCHARGE MEDICATIONS: 1. Buspirone 7.5  mg 1 p.o. b.i.d. p.r.n. anxiety.  2. Colace 240 mg p.o. daily.  3. Ferrous sulfate 325 mg p.o. daily as needed for iron supplementation.  4. Imdur 60 mg p.o. daily.  5. Omeprazole 20 mg p.o. daily.  6. Vitamin B12 500 mcg p.o. daily.   7. Vitamin D3 4000 units p.o. daily for vitamin D deficiency.  8. Pravastatin 40 mg p.o. at bedtime.  9. Olanzapine 2.5 mg p.o. at bedtime for anxiety.  10. Prednisone taper starting at 20 mg daily, decreasing x 10 mg every 2 days until done.  11. Losartan 100 mg p.o. daily.  12. Clonidine 0.2 mg p.o. b.i.d.  13. Carvedilol 25 mg p.o. b.i.d.  14. Add DuoNeb SVNs 3 mL inhaled every 6 hours as needed for wheezing.  15. Amlodipine 10 mg p.o. daily.  16. Levofloxacin 250 mg p.o. every 48 hours x 2 days.  This is the patient complete a 7-day course of antibiotics.  17. Hydralazine 25 mg p.o. 3 times daily.   CODE STATUS: The patient is DO NOT RESUSCITATE. An out of facility DO NOT RESUSCITATE order will be sent with her to the nursing facility. If she does have further decline, consideration should be made for hospice care.   TIME SPENT: At the time of discharge, 35 minutes.   ____________________________ Adin Hector, MD bjk:rw D: 05/23/2013 13:15:00 ET T: 05/23/2013 14:06:09 ET JOB#: [id number]  cc: Adin Hector, MD, <Dictator> Ramonita Lab MD ELECTRONICALLY SIGNED 05/30/2013 22:16

## 2015-02-27 NOTE — H&P (Signed)
PATIENT NAME:  [name], [name] MR#:  [id number] DATE OF BIRTH:  1918/05/30  DATE OF ADMISSION:  05/19/2013  PRIMARY CARE PHYSICIAN:  Dr. Daniel NonesBert Klein.  CHIEF COMPLAINT: "I can't breathe."   HISTORY OF PRESENT ILLNESS: This is a 81 year old female with chronic respiratory failure and COPD, on chronic oxygen. She presents to the ER with a chief complaint of "I can't breathe as good", and that has been going on for the last 3 or 4 days. She stated she had a fever on Tuesday, but unable to tell me how high. She is feeling sweaty, slight cough but nonproductive. A lot of postnasal drip. No wheezing. In the ER she had a right lower lobe pneumonia on chest x-ray. Hospitalist services were contacted for further evaluation.   PAST MEDICAL HISTORY: Hypertension, hyperlipidemia, anxiety, gastroesophageal reflux disease, chronic respiratory failure, on 3 to 4 liters of oxygen secondary to COPD, anemia, chronic kidney disease, osteoporosis.   PAST SURGICAL HISTORY: Hysterectomy, hip fracture, cataract, and kyphoplasty.   ALLERGIES: SUDAFED, PENICILLIN, TETANUS.   SOCIAL HISTORY: Lives with her sister. Quit smoking 50 years ago. Occasional glass of wine.   FAMILY HISTORY: Father died at 5273 of lung cancer. Mother died at 5391 of renal failure.   MEDICATIONS: Include clonidine 0.2 mg twice a day, Imdur 60 mg daily, omeprazole 20 mg daily, pravastatin 40 mg at bedtime, occasional albuterol inhaler, losartan 100 mg daily, B12 500 mcg daily, vitamin D 4000 international units daily, Coreg 12.5 mg b.i.d., olanzapine 2.5 mg at bedtime.   REVIEW OF SYSTEMS:  CONSTITUTIONAL: Positive for fever. Positive for sweats. Positive for weight loss, 10 to  15 pounds over the last 3 months. Positive for weakness.   EYES: She does wear glasses for reading.   EARS, NOSE, MOUTH, AND THROAT: Decreased hearing, last few days. Positive for runny nose. No sore throat. No difficulty swallowing.  CARDIOVASCULAR: No chest pain. No  palpitations.  RESPIRATORY: Positive for shortness of breath. Slight cough. No production. No wheeze. No hemoptysis.  GASTROINTESTINAL: No nausea. No vomiting. No abdominal pain. No bright red blood per rectum. No melena. No diarrhea. No constipation.  GENITOURINARY: No burning on urination. No hematuria.  MUSCULOSKELETAL: No joint pain.  INTEGUMENT: No rashes or eruptions.  NEUROLOGIC: No fainting or blackouts.  PSYCHIATRIC: Positive for anxiety.  ENDOCRINE: No thyroid problems.  HEMATOLOGIC/LYMPHATIC: History of anemia.   PHYSICAL EXAMINATION: VITAL SIGNS: On presentation to the ER included a blood pressure 213/93, pulse ox  95% on  3 liters, dropped to 74% on room air, respirations 24, temperature 97.6, pulse 81.  GENERAL: Slight respiratory distress. Positive for using accessory muscles to breathe.  EYES: Conjunctivae and lids normal. Pupils equal, round, and reactive to light. Extraocular muscles intact. No nystagmus.  EARS, NOSE, MOUTH, AND THROAT: Tympanic membranes: No erythema. Nasal mucosa: No erythema.  THROAT: No erythema, no exudate seen.  LIPS, TEETH AND GUMS: No lesions.  NECK: No JVD. No bruits. No lymphadenopathy. No thyromegaly. No thyroid nodules palpated.  RESPIRATORY: Decreased breath sounds bilaterally. Positive wheeze throughout entire lung field. Decreased breath sounds and rhonchi, right lung base. Positive use of accessory muscles to breathe.  CARDIOVASCULAR SYSTEM: S1, S2. Positive 2/6 systolic ejection murmur. Carotid upstroke 2+ bilaterally. No bruits.  EXTREMITIES: Dorsalis pedis pulses 2+. No edema of the lower extremity.  ABDOMEN: Soft, nontender. No organosplenomegaly. Normoactive bowel sounds. No masses felt.  LYMPHATIC: No lymph nodes in the neck.  MUSCULOSKELETAL: No clubbing, edema or cyanosis. On oxygen.  SKIN: Small ulceration, left lower extremity.   NEUROLOGICAL: Cranial nerves II through XII grossly intact. Deep tendon reflexes,  2+ bilateral  lower extremities.  PSYCHIATRIC: The patient is oriented to person, place and time.   LABORATORY AND RADIOLOGICAL DATA: Chest x-ray showed a right lower lobe pneumonia. Troponin negative.   White blood cell count 7.1, H and H  8.3 and 24.5, platelet count of 129. Glucose 201, BUN 39, creatinine 1.63, sodium 141, potassium 4.0, chloride 101, CO2 39, calcium 8.3. GFR 27.   EKG showed normal sinus rhythm, 73 beats per minute, LVH.   ASSESSMENT AND PLAN: 1.  Acute-on-chronic respiratory failure with chronic obstructive pulmonary disease exacerbation and pneumonia: Will continue oxygen supplementation, give IV Solu-Medrol 60 mg IV once and 40 mg IV q.12 hours, DuoNeb nebulizer solution and IV Levaquin.  2.  Pneumonia: Will obtain a sputum culture. Blood cultures already sent. Will put her on Levaquin daily and continue to monitor her clinical status.  3.  Chronic kidney disease, stage IV. Will give gentle IV fluid hydration; see if the creatinine improves.  4.  Anemia: Looks like this is chronic. Will send off iron studies and guaiac stools.  5.  Malignant hypertension on presentation: Continue usual medications and continue to monitor. This could be secondary to her trouble breathing on presentation.  6.  Hyperlipidemia: Continue pravastatin.  7.  Anxiety: Not on any medications for this, but does take olanzapine at night.  8.  Gastroesophageal reflux disease: Continue omeprazole.  9.  Osteoporosis, with her history of kyphoplasty.  10.  Elevated glucose on presentation: Could be a stress response. I will check a hemoglobin  A1c to check for diabetes and put on a sliding-scale for right now.   Time spent on admission: 55 minutes.   THE PATIENT WISHES TO BE A FULL CODE, BUT DOES NOT WANT TO BE ON A VENTILATOR LONG-TERM.    ____________________________ Herschell Dimes. Renae Gloss, MD rjw:[id number] D: 05/19/2013 12:21:37 ET T: 05/19/2013 14:38:53 ET JOB#: [id number]  cc: Herschell Dimes. Renae Gloss, MD,  <Dictator> Curtis Sites III, MD Salley Scarlet MD ELECTRONICALLY SIGNED 05/21/2013 19:13

## 2015-02-27 NOTE — Consult Note (Signed)
General Aspect 81 year old female with history of chronic obstructive pulmonary disease with chronic respiratory failure, refractory hypertension, anemia with history of GI bleeding, renal insufficiency that presented to the hospital after several days of increased respiratory distress and pedal edema. Patient does live with her sister who actually called 911 when she found her in respiratory distress. Patient was found to have pneumonia which is under treatment, with chronic anemia with a hemoglobin around 8, an echocardiogram showing moderate LV dysfunction with an EF of 30% consistent with previous MI and significant valve disease.  Patient has been diuresed with IV Lasix and has improved.The history is given by the sister due to the patient  currently sleeping.   Physical Exam:  GEN no acute distress, sleeping   HEENT pale conjunctivae   RESP normal resp effort  clear BS   CARD Regular rate and rhythm  Normal, S1, S2   EXTR negative edema   SKIN skin turgor poor   NEURO cranial nerves intact, motor/sensory function intact   PSYCH alert, A+O to time, place, person   Review of Systems:  Subjective/Chief Complaint Respiratory distress   Respiratory: Short of breath   Cardiovascular: Edema  lower extremity   chronic   ROS Pt not able to provide ROS  given by  the sister   Radiology Results: CT:    14-Jul-14 08:34, CT Chest Without Contrast  CT Chest Without Contrast   REASON FOR EXAM:    progressive dyspnea/abnormal CXR in pt with pulmonary   fibrosis/chronic resp fail  COMMENTS:       PROCEDURE: CT  - CT CHEST WITHOUT CONTRAST  - May 20 2013  8:34AM     RESULT: Axial CT scanning was performed through the chest with   reconstructions at 3 mm intervals and slice thicknesses. Images were   repeated due to motion artifact.    At mediastinal window settings there are small to moderate sized   bilateral pleural effusions layering posteriorly. There are mildly   enlarged  pretracheal and precarinal and subcarinal lymph nodes measuring   up to 2 cm in long axis. There are smaller enlarged hilar lymph nodes   which are poorly defined given the lack of IV contrast. The cardiac     chambers are top normal in size. There is no significant pericardial   effusion. The caliber of the thoracic aorta is normal.    At lung window settings there is evidence of previous granulomatous   infection manifested by a calcified nodule in the anterior-inferior   aspect of the right lung. There are fibrotic changes in both lungs. There   is mild bronchiectatic change bilaterally. There is no classic alveolar   pneumonia.    Within the upper abdomen the observed portions of the liver and spleen   appear normal. There are no adrenal masses. There is likely a cyst in the   upper pole of the right kidney which measures 2 cm in greatest dimension.    IMPRESSION:   1. There are emphysematous changes and bronchiectatic changes in both     lungs with superimposed pulmonary fibrosis. There is peribronchial   cuffing bilaterally and evidence of previous granulomatous infection.  2. There are moderate sized bilateral pleural effusions layering   posteriorly.   3. There are enlarged mediastinal and likely enlarged hilar lymph nodes.   The study is limited in this regard due to the lack of IV contrast.  4. There is no overt evidence of CHF though  there is mild enlargement of   the cardiac chambers.     Dictation Site: 1        Verified By: DAVID A. SwazilandJORDAN, M.D., MD    Sudafed: Other  Tetanus Toxoid: Unknown  PCN: Unknown  Amoxicillin: Unknown   Impression 81 year old female with  worsening shortness of breath, with diagnosis of pneumonia and acute on chronic systolic heart failure,  exacerbated by chronic kidney disease, refractory hypertension, anemia, lung disease with pulmonary hypertension.   Plan 1.  Treatment of chronic issues such as chronic kidney disease, anemia and  lung disease, hypertension. 2.  Continue treatment of pneumonia . 3.  Continue use diuretics for pulmonary edema with consideration of low dose diuretic on discharge. 4.  Discussed with the sister daily weights,and  avoiding salt to manage heart failure. 5.  Further recommendations per Dr. Gwen PoundsKowalski.   Electronic Signatures: Rudi Cocoarroll, Donna (NP)  (Signed 15-Jul-14 14:23)  Authored: General Aspect/Present Illness, History and Physical Exam, Review of System, Radiology, Allergies, Impression/Plan   Last Updated: 15-Jul-14 14:23 by Rudi Cocoarroll, Donna (NP)
